# Patient Record
Sex: Male | Born: 1990 | Race: White | Hispanic: No | Marital: Single | Smoking: Current every day smoker
Health system: Southern US, Community
[De-identification: ages and names within clinical notes are randomized; demographics above are authoritative.]

## PROBLEM LIST (undated history)

## (undated) DIAGNOSIS — F909 Attention-deficit hyperactivity disorder, unspecified type: Secondary | ICD-10-CM

## (undated) DIAGNOSIS — N200 Calculus of kidney: Secondary | ICD-10-CM

## (undated) HISTORY — PX: TENDON REPAIR: SHX5111

---

## 2020-06-27 ENCOUNTER — Other Ambulatory Visit: Payer: Self-pay

## 2020-06-27 ENCOUNTER — Encounter: Payer: Self-pay | Admitting: Emergency Medicine

## 2020-06-27 DIAGNOSIS — N200 Calculus of kidney: Secondary | ICD-10-CM | POA: Insufficient documentation

## 2020-06-27 DIAGNOSIS — R109 Unspecified abdominal pain: Secondary | ICD-10-CM | POA: Insufficient documentation

## 2020-06-27 DIAGNOSIS — R110 Nausea: Secondary | ICD-10-CM | POA: Insufficient documentation

## 2020-06-27 DIAGNOSIS — Z5321 Procedure and treatment not carried out due to patient leaving prior to being seen by health care provider: Secondary | ICD-10-CM | POA: Insufficient documentation

## 2020-06-27 LAB — URINALYSIS, COMPLETE (UACMP) WITH MICROSCOPIC
Bacteria, UA: NONE SEEN
Bilirubin Urine: NEGATIVE
Glucose, UA: NEGATIVE mg/dL
Ketones, ur: NEGATIVE mg/dL
Leukocytes,Ua: NEGATIVE
Nitrite: NEGATIVE
Protein, ur: NEGATIVE mg/dL
RBC / HPF: >50 RBC/hpf — ABNORMAL HIGH (ref 0–5)
Specific Gravity, Urine: 1.024 (ref 1.005–1.030)
Squamous Epithelial / HPF: NONE SEEN (ref 0–5)
pH: 5 (ref 5.0–8.0)

## 2020-06-27 LAB — BASIC METABOLIC PANEL
Anion gap: 8 (ref 5–15)
BUN: 18 mg/dL (ref 6–20)
CO2: 30 mmol/L (ref 22–32)
Calcium: 9.6 mg/dL (ref 8.9–10.3)
Chloride: 102 mmol/L (ref 98–111)
Creatinine, Ser: 0.8 mg/dL (ref 0.61–1.24)
GFR, Estimated: >60 mL/min (ref >60)
Glucose, Bld: 99 mg/dL (ref 70–99)
Potassium: 4 mmol/L (ref 3.5–5.1)
Sodium: 140 mmol/L (ref 135–145)

## 2020-06-27 LAB — CBC
HCT: 41.3 % (ref 39.0–52.0)
Hemoglobin: 13.7 g/dL (ref 13.0–17.0)
MCH: 28.9 pg (ref 26.0–34.0)
MCHC: 33.2 g/dL (ref 30.0–36.0)
MCV: 87.1 fL (ref 80.0–100.0)
Platelets: 297 10*3/uL (ref 150–400)
RBC: 4.74 MIL/uL (ref 4.22–5.81)
RDW: 13 % (ref 11.5–15.5)
WBC: 6.5 10*3/uL (ref 4.0–10.5)
nRBC: 0 % (ref 0.0–0.2)

## 2020-06-27 NOTE — ED Triage Notes (Signed)
Pt arrived via POV with reports of L flank pain and nausea. Pt states he has hx of kidney stones, sxs started 2 weeks ago.  Pt reports he had multiple stones in L kidney last time.

## 2020-06-28 ENCOUNTER — Emergency Department
Admission: EM | Admit: 2020-06-28 | Discharge: 2020-06-28 | Disposition: A | Discharge status: Home / Self Care | Payer: Self-pay | Attending: Emergency Medicine | Admitting: Emergency Medicine

## 2020-06-28 HISTORY — DX: Calculus of kidney: N20.0

## 2021-04-01 ENCOUNTER — Emergency Department
Admission: EM | Admit: 2021-04-01 | Discharge: 2021-04-01 | Disposition: A | Discharge status: Home / Self Care | Payer: Self-pay | Attending: Student in an Organized Health Care Education/Training Program | Admitting: Student in an Organized Health Care Education/Training Program

## 2021-04-01 ENCOUNTER — Encounter: Payer: Self-pay | Admitting: Radiology

## 2021-04-01 ENCOUNTER — Other Ambulatory Visit: Payer: Self-pay

## 2021-04-01 ENCOUNTER — Emergency Department: Payer: Self-pay

## 2021-04-01 DIAGNOSIS — F17210 Nicotine dependence, cigarettes, uncomplicated: Secondary | ICD-10-CM | POA: Insufficient documentation

## 2021-04-01 DIAGNOSIS — Z20822 Contact with and (suspected) exposure to covid-19: Secondary | ICD-10-CM | POA: Insufficient documentation

## 2021-04-01 DIAGNOSIS — J069 Acute upper respiratory infection, unspecified: Secondary | ICD-10-CM | POA: Insufficient documentation

## 2021-04-01 LAB — RESP PANEL BY RT-PCR (FLU A&B, COVID) ARPGX2
Influenza A by PCR: NEGATIVE
Influenza B by PCR: NEGATIVE
SARS Coronavirus 2 by RT PCR: NEGATIVE

## 2021-04-01 MED ORDER — BENZONATATE 100 MG PO CAPS: ORAL_CAPSULE | ORAL | 0 refills | Status: AC

## 2021-04-01 MED ORDER — AZITHROMYCIN 250 MG PO TABS: ORAL_TABLET | ORAL | 0 refills | Status: DC

## 2021-04-01 MED ORDER — PREDNISONE 20 MG PO TABS: 40.0000 mg | ORAL_TABLET | Freq: Every day | ORAL | 0 refills | Status: AC

## 2021-04-01 NOTE — Discharge Instructions (Addendum)
Take the prescription meds as directed. Follow-up with your provider for continued symptoms.  

## 2021-04-01 NOTE — ED Triage Notes (Signed)
Pt states cough with yellow sputum production. Pt states for a week has been having nasal drainage for a week. Pt states today had a fever. Pt appears in no acute distress.

## 2021-04-03 NOTE — ED Provider Notes (Signed)
[facility] Emergency Department Provider Note ____________________________________________  Time seen: 2255  I have reviewed the triage vital signs and the nursing notes.  HISTORY  Chief Complaint  Cough   HPI Thomas Buck is a 30 y.o. male presents himself to the ED for evaluation of productive cough as well as some nasal drainage and congestion for the last week.  Patient denies any known fevers until today noting low-grade temps.  He denies any shortness of breath, chest pain, nausea, vomiting, or diarrhea.  Past Medical History:  Diagnosis Date   Kidney stones     There are no problems to display for this patient.   History reviewed. No pertinent surgical history.  Prior to Admission medications   Medication Sig Start Date End Date Taking? Authorizing Provider  azithromycin (ZITHROMAX Z-PAK) 250 MG tablet Take 2 tablets (500 mg) on  Day 1,  followed by 1 tablet (250 mg) once daily on Days 2 through 5. 04/01/21  Yes Jadea Shiffer, Charlesetta Ivory, PA-C  benzonatate (TESSALON PERLES) 100 MG capsule Take 1-2 tabs TID prn cough 04/01/21  Yes Margaret Staggs, Charlesetta Ivory, PA-C  predniSONE (DELTASONE) 20 MG tablet Take 2 tablets (40 mg total) by mouth daily with breakfast for 5 days. 04/01/21 04/06/21 Yes Linsie Lupo, Charlesetta Ivory, PA-C    Allergies Meloxicam  History reviewed. No pertinent family history.  Social History Social History   Tobacco Use   Smoking status: Every Day    Packs/day: 0.50    Types: Cigarettes   Smokeless tobacco: Never    Review of Systems  Constitutional: Negative for fever. Eyes: Negative for visual changes. ENT: Negative for sore throat. Cardiovascular: Negative for chest pain. Respiratory: Negative for shortness of breath.  Reports productive cough as above. Gastrointestinal: Negative for abdominal pain, vomiting and diarrhea. Genitourinary: Negative for dysuria. Musculoskeletal: Negative for back pain. Skin: Negative for  rash. Neurological: Negative for headaches, focal weakness or numbness. ____________________________________________  PHYSICAL EXAM:  VITAL SIGNS: ED Triage Vitals  Enc Vitals Group     BP 04/01/21 1913 108/65     Pulse Rate 04/01/21 1913 81     Resp 04/01/21 1913 18     Temp 04/01/21 1913 99 F (37.2 C)     Temp Source 04/01/21 1913 Oral     SpO2 04/01/21 1913 100 %     Weight 04/01/21 1914 155 lb (70.3 kg)     Height 04/01/21 1914 5\' 7"  (1.702 m)     Head Circumference --      Peak Flow --      Pain Score 04/01/21 1914 8     Pain Loc --      Pain Edu? --      Excl. in GC? --     Constitutional: Alert and oriented. Well appearing and in no distress. Head: Normocephalic and atraumatic. Eyes: Conjunctivae are normal. PERRL. Normal extraocular movements Ears: Canals clear. TMs intact bilaterally. Nose: No congestion/rhinorrhea/epistaxis. Mouth/Throat: Mucous membranes are moist. Neck: Supple. No thyromegaly. Hematological/Lymphatic/Immunological: No cervical lymphadenopathy. Cardiovascular: Normal rate, regular rhythm. Normal distal pulses. Respiratory: Normal respiratory effort. No wheezes/rales/rhonchi. Gastrointestinal: Soft and nontender. No distention. Musculoskeletal: Nontender with normal range of motion in all extremities.  Neurologic:  Normal gait without ataxia. Normal speech and language. No gross focal neurologic deficits are appreciated. Skin:  Skin is warm, dry and intact. No rash noted. Psychiatric: Mood and affect are normal. Patient exhibits appropriate insight and judgment. ____________________________________________    {LABS (pertinent positives/negatives) Labs Reviewed  RESP PANEL BY RT-PCR (FLU A&B, COVID) ARPGX2  ____________________________________________  {EKG  ____________________________________________   RADIOLOGY  CXR  IMPRESSION: No active cardiopulmonary  disease. ____________________________________________  PROCEDURES    Procedures ____________________________________________   INITIAL IMPRESSION / ASSESSMENT AND PLAN / ED COURSE  As part of my medical decision making, I reviewed the following data within the electronic MEDICAL RECORD NUMBER Labs reviewed WNL, Radiograph reviewed NAD, and Notes from prior ED visits   DDX: CAP, covid, influenza  Patient ED evaluation of productive cough and nasal drainage for the last week.  He is evaluated in the ED for his complaints, found to have a negative viral panel screen and a reassuring chest x-ray.  He will be treated empirically with antibiotics, steroids, and cough medicines.  He is encouraged to follow with primary provider or return to the ED for worsening symptoms.  Thomas Buck was evaluated in Emergency Department on 04/03/2021 for the symptoms described in the history of present illness. He was evaluated in the context of the global COVID-19 pandemic, which necessitated consideration that the patient might be at risk for infection with the SARS-CoV-2 virus that causes COVID-19. Institutional protocols and algorithms that pertain to the evaluation of patients at risk for COVID-19 are in a state of rapid change based on information released by regulatory bodies including the CDC and federal and state organizations. These policies and algorithms were followed during the patient's care in the ED. ____________________________________________  FINAL CLINICAL IMPRESSION(S) / ED DIAGNOSES  Final diagnoses:  Viral URI with cough      Gerasimos Plotts, Charlesetta Ivory, PA-C 04/03/21 2032    Willy Eddy, MD 04/04/21 [id number]

## 2021-08-03 ENCOUNTER — Other Ambulatory Visit: Payer: Self-pay

## 2021-08-03 ENCOUNTER — Emergency Department: Payer: Self-pay

## 2021-08-03 DIAGNOSIS — Z5321 Procedure and treatment not carried out due to patient leaving prior to being seen by health care provider: Secondary | ICD-10-CM | POA: Insufficient documentation

## 2021-08-03 LAB — COMPREHENSIVE METABOLIC PANEL
ALT: 28 U/L (ref 0–44)
AST: 28 U/L (ref 15–41)
Albumin: 4.2 g/dL (ref 3.5–5.0)
Alkaline Phosphatase: 48 U/L (ref 38–126)
Anion gap: 6 (ref 5–15)
BUN: 13 mg/dL (ref 6–20)
CO2: 29 mmol/L (ref 22–32)
Calcium: 9.3 mg/dL (ref 8.9–10.3)
Chloride: 104 mmol/L (ref 98–111)
Creatinine, Ser: 0.84 mg/dL (ref 0.61–1.24)
GFR, Estimated: >60 mL/min (ref >60)
Glucose, Bld: 84 mg/dL (ref 70–99)
Potassium: 4 mmol/L (ref 3.5–5.1)
Sodium: 139 mmol/L (ref 135–145)
Total Bilirubin: 0.4 mg/dL (ref 0.3–1.2)
Total Protein: 6.8 g/dL (ref 6.5–8.1)

## 2021-08-03 LAB — CBC
HCT: 40.9 % (ref 39.0–52.0)
Hemoglobin: 13.2 g/dL (ref 13.0–17.0)
MCH: 28.7 pg (ref 26.0–34.0)
MCHC: 32.3 g/dL (ref 30.0–36.0)
MCV: 88.9 fL (ref 80.0–100.0)
Platelets: 267 10*3/uL (ref 150–400)
RBC: 4.6 MIL/uL (ref 4.22–5.81)
RDW: 13.2 % (ref 11.5–15.5)
WBC: 7.9 10*3/uL (ref 4.0–10.5)
nRBC: 0 % (ref 0.0–0.2)

## 2021-08-03 LAB — TROPONIN I (HIGH SENSITIVITY): Troponin I (High Sensitivity): 2 ng/L (ref <18)

## 2021-08-03 NOTE — ED Provider Triage Note (Signed)
Emergency Medicine Provider Triage Evaluation Note  Thomas Buck, a 31 y.o. male  was evaluated in triage.  Pt complains of chest pain, with onset yesterday. He describes central chest pain that feels like pressure. He denies nausea, SOB, or diaphoresis.   Review of Systems  Positive: CP Negative: FCS  Physical Exam  There were no vitals taken for this visit. Gen:   Awake, no distress   Resp:  Normal effort CTA MSK:   Moves extremities without difficulty  Other:  CVS: RRR  Medical Decision Making  Medically screening exam initiated at 7:45 PM.  Appropriate orders placed.  [name] was informed that the remainder of the evaluation will be completed by another provider, this initial triage assessment does not replace that evaluation, and the importance of remaining in the ED until their evaluation is complete.  Patient with ED evaluation of chest pain with onset yesterday.    [name], PA-C 08/03/21 1950

## 2021-08-03 NOTE — ED Triage Notes (Signed)
Pt with central chest pain for several days. Pt states is also having pain in his back. MSE performed in triage.

## 2021-08-04 ENCOUNTER — Emergency Department
Admission: EM | Admit: 2021-08-04 | Discharge: 2021-08-04 | Discharge status: Against Medical Advice | Payer: Self-pay | Attending: Emergency Medicine | Admitting: Emergency Medicine

## 2021-08-04 NOTE — ED Notes (Signed)
Called pt several times no answer  

## 2022-04-07 IMAGING — CR DG CHEST 2V
1 series · 2 of 2 positions shown · non-contrast
Comparison: 04/01/2021

CLINICAL DATA: Chest pain

EXAM:
CHEST - 2 VIEW

[Series 1: dg chest 2 view · 0.14mm/px · 2 of 2 slices shown]
[im 1/2]
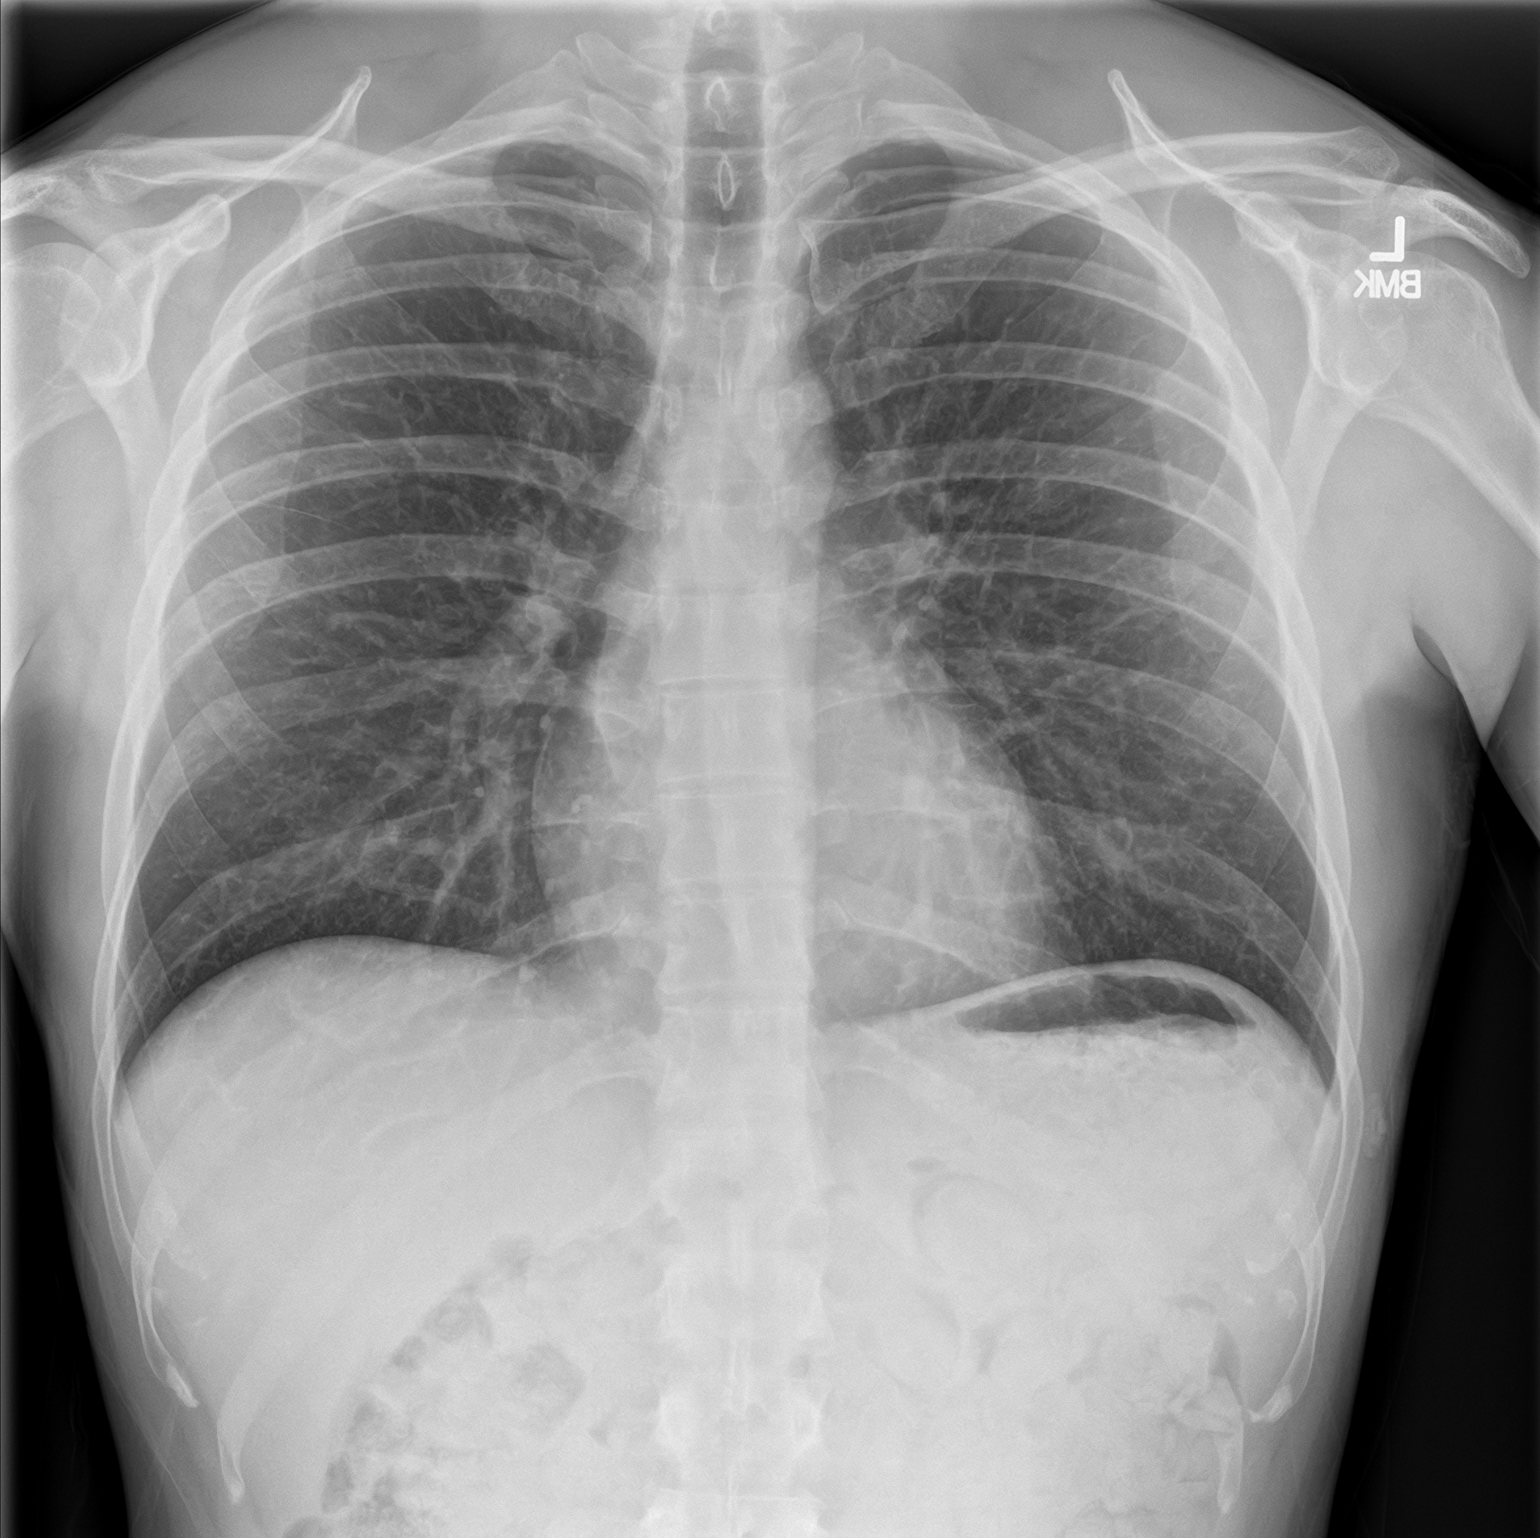
[im 2/2]
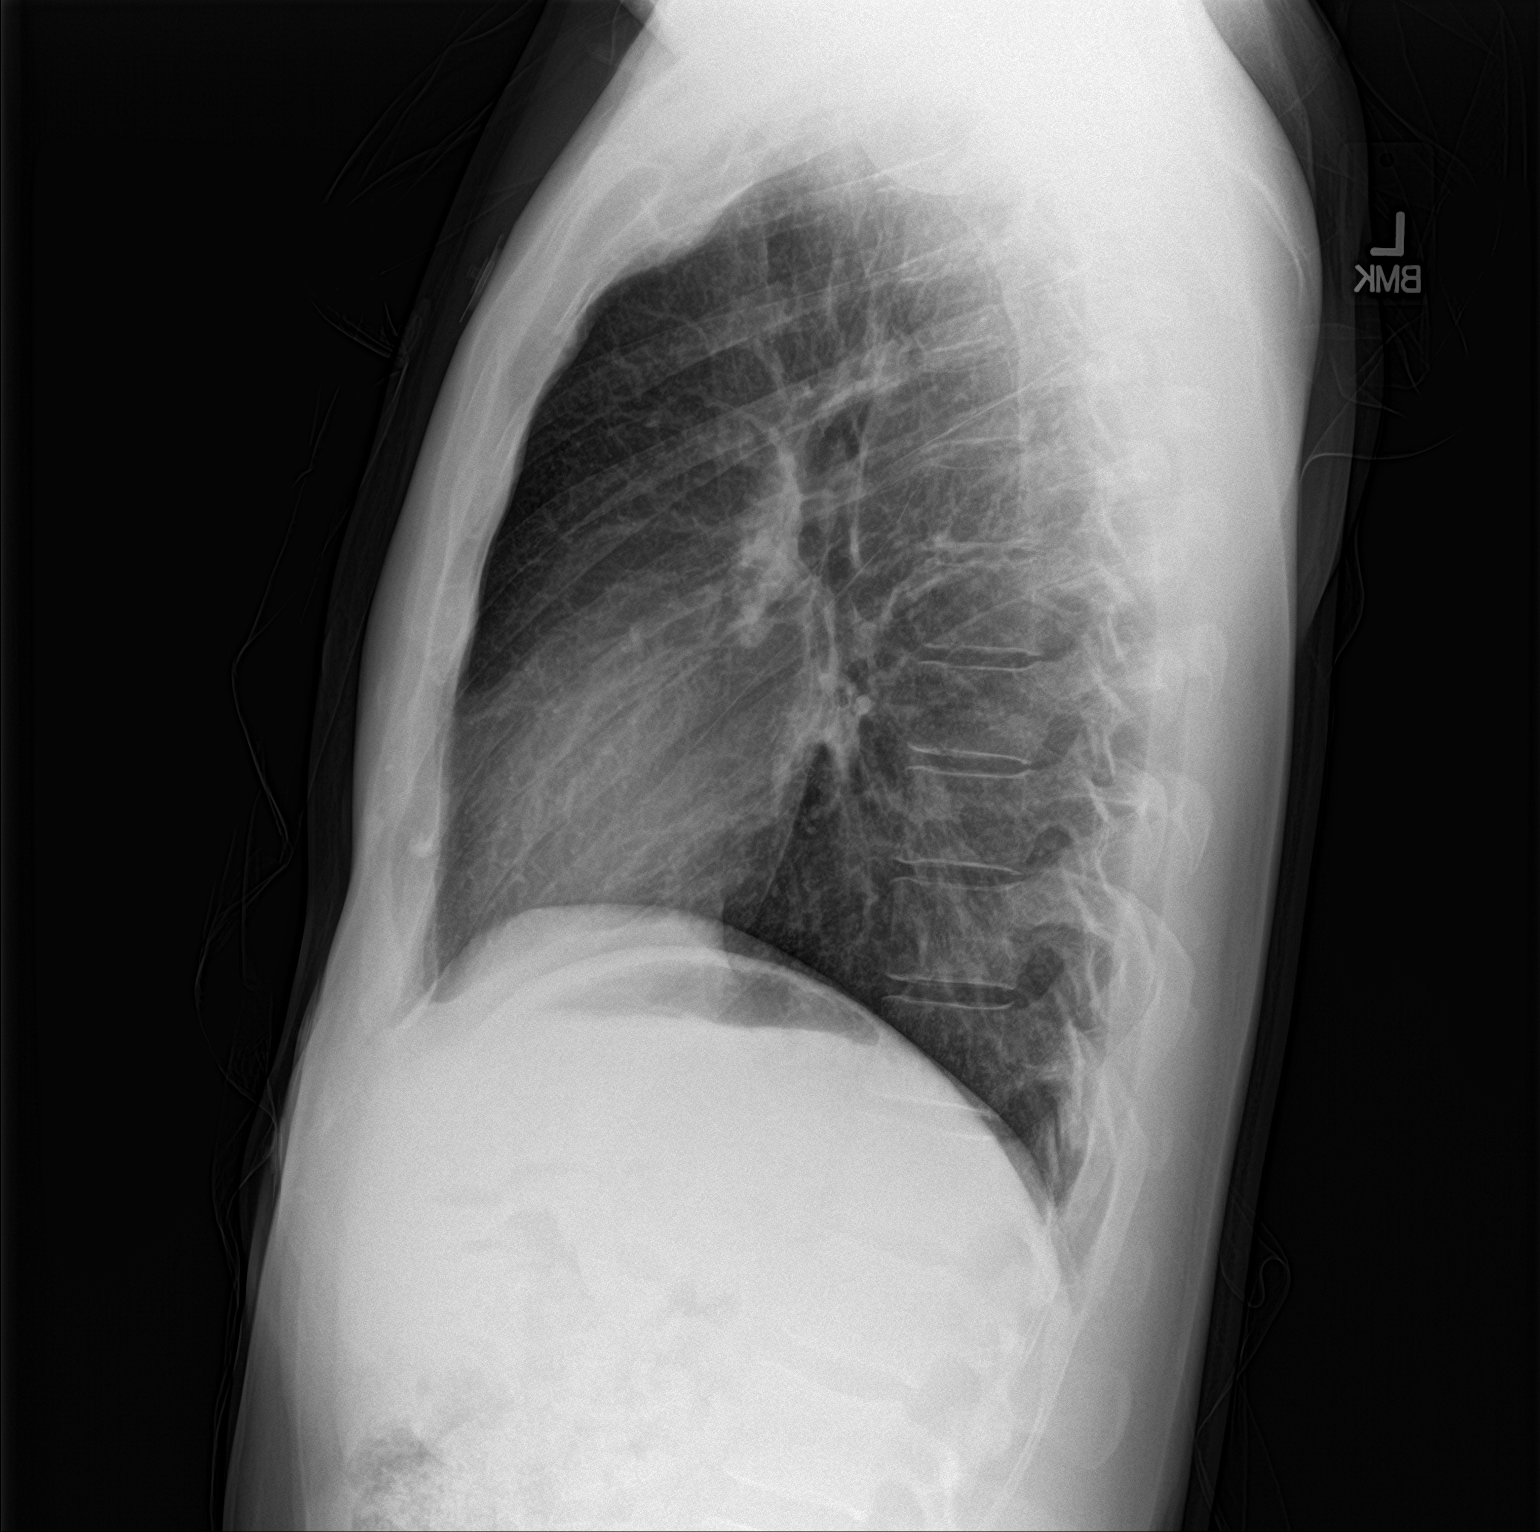

[2 of 2 positions shown; findings below may reference images not displayed]

FINDINGS: The heart size and mediastinal contours are within normal limits.
Both lungs are clear. The visualized skeletal structures are
unremarkable.
IMPRESSION: Negative.

## 2023-04-03 ENCOUNTER — Other Ambulatory Visit: Payer: Self-pay

## 2023-04-03 ENCOUNTER — Encounter: Payer: Self-pay | Admitting: Emergency Medicine

## 2023-04-03 DIAGNOSIS — R911 Solitary pulmonary nodule: Secondary | ICD-10-CM | POA: Insufficient documentation

## 2023-04-03 DIAGNOSIS — R1033 Periumbilical pain: Secondary | ICD-10-CM | POA: Insufficient documentation

## 2023-04-03 NOTE — ED Triage Notes (Signed)
Pt presents ambulatory to triage via POV with complaints of lower abdominal pain x 2 weeks. Pt endorses a "buldge" resembling a hernia but it hasn't been confirmed. Endorses pain 2/10 without radiation. He notes that the pain becomes so intense it makes him nauseated. A&Ox4 at this time. Denies CP or SOB.

## 2023-04-04 ENCOUNTER — Emergency Department
Admission: EM | Admit: 2023-04-04 | Discharge: 2023-04-04 | Disposition: A | Discharge status: Home / Self Care | Payer: BC Managed Care – PPO | Attending: Emergency Medicine | Admitting: Emergency Medicine

## 2023-04-04 ENCOUNTER — Emergency Department: Payer: BC Managed Care – PPO

## 2023-04-04 DIAGNOSIS — R911 Solitary pulmonary nodule: Secondary | ICD-10-CM

## 2023-04-04 DIAGNOSIS — R1033 Periumbilical pain: Secondary | ICD-10-CM

## 2023-04-04 LAB — CBC
HCT: 39 % (ref 39.0–52.0)
Hemoglobin: 12.9 g/dL — ABNORMAL LOW (ref 13.0–17.0)
MCH: 28.8 pg (ref 26.0–34.0)
MCHC: 33.1 g/dL (ref 30.0–36.0)
MCV: 87.1 fL (ref 80.0–100.0)
Platelets: 318 10*3/uL (ref 150–400)
RBC: 4.48 MIL/uL (ref 4.22–5.81)
RDW: 13.5 % (ref 11.5–15.5)
WBC: 5.3 10*3/uL (ref 4.0–10.5)
nRBC: 0 % (ref 0.0–0.2)

## 2023-04-04 LAB — COMPREHENSIVE METABOLIC PANEL
ALT: 21 U/L (ref 0–44)
AST: 20 U/L (ref 15–41)
Albumin: 4.2 g/dL (ref 3.5–5.0)
Alkaline Phosphatase: 54 U/L (ref 38–126)
Anion gap: 8 (ref 5–15)
BUN: 19 mg/dL (ref 6–20)
CO2: 29 mmol/L (ref 22–32)
Calcium: 9.5 mg/dL (ref 8.9–10.3)
Chloride: 101 mmol/L (ref 98–111)
Creatinine, Ser: 0.82 mg/dL (ref 0.61–1.24)
GFR, Estimated: >60 mL/min (ref >60)
Glucose, Bld: 92 mg/dL (ref 70–99)
Potassium: 3.9 mmol/L (ref 3.5–5.1)
Sodium: 138 mmol/L (ref 135–145)
Total Bilirubin: 0.7 mg/dL (ref 0.3–1.2)
Total Protein: 7.3 g/dL (ref 6.5–8.1)

## 2023-04-04 LAB — URINALYSIS, ROUTINE W REFLEX MICROSCOPIC
Bacteria, UA: NONE SEEN
Bilirubin Urine: NEGATIVE
Glucose, UA: NEGATIVE mg/dL
Ketones, ur: NEGATIVE mg/dL
Leukocytes,Ua: NEGATIVE
Nitrite: NEGATIVE
Protein, ur: NEGATIVE mg/dL
Specific Gravity, Urine: 1.018 (ref 1.005–1.030)
Squamous Epithelial / HPF: 0 /HPF (ref 0–5)
pH: 5 (ref 5.0–8.0)

## 2023-04-04 LAB — LIPASE, BLOOD: Lipase: 37 U/L (ref 11–51)

## 2023-04-04 NOTE — Discharge Instructions (Addendum)
No signs of a hernia or kidney stone.   Nodule on the left side of your lung, as we talked about

## 2023-04-04 NOTE — ED Notes (Signed)
Patient discharged at this time. Ambulated to lobby with independent and steady gait. Breathing unlabored speaking in full sentences. Verbalized understanding of all discharge, follow up, and medication teaching. Discharged homed with all belongings.

## 2023-04-04 NOTE — ED Provider Notes (Signed)
[facility] Provider Note    Event Date/Time   First MD Initiated Contact with Patient 04/04/23 0110     (approximate)   History   Abdominal Pain   HPI  Thomas Buck is a 32 y.o. male who presents to the ED for evaluation of Abdominal Pain   Review of PCP visit from 2 months ago.  Chronic pain, ADHD and opiate addiction in the past who is currently on Suboxone.  Patient presents for evaluation of intermittent periumbilical abdominal pain over the past few weeks and seem to be worsening throughout the day today.  Does report a history of kidney stones but uncertain if that is what he is feeling.  Reports concern for hernia because he thought he felt something on his anterior abdomen just superior to his bellybutton.  Reports compliance to his Suboxone.  No other medications.  No emesis or stool changes, chest pain.  Sometimes nausea.  No history of abdominal surgeries   Physical Exam   Triage Vital Signs: ED Triage Vitals  Encounter Vitals Group     BP 04/03/23 2352 126/77     Systolic BP Percentile --      Diastolic BP Percentile --      Pulse Rate 04/03/23 2352 (!) 58     Resp 04/03/23 2352 18     Temp 04/03/23 2352 98 F (36.7 C)     Temp Source 04/03/23 2352 Oral     SpO2 04/03/23 2352 100 %     Weight 04/03/23 2351 150 lb (68 kg)     Height 04/03/23 2351 5\' 7"  (1.702 m)     Head Circumference --      Peak Flow --      Pain Score 04/03/23 2351 2     Pain Loc --      Pain Education --      Exclude from Growth Chart --     Most recent vital signs: Vitals:   04/03/23 2352  BP: 126/77  Pulse: (!) 58  Resp: 18  Temp: 98 F (36.7 C)  SpO2: 100%    General: Awake, no distress.  CV:  Good peripheral perfusion.  Resp:  Normal effort.  Abd:  No distention.  No appreciable hernia to the anterior abdomen around the site of concern.  No reducible soft masses throughout the abdomen.  Soft and nontender throughout. MSK:  No deformity  noted.  Neuro:  No focal deficits appreciated. Other:     ED Results / Procedures / Treatments   Labs (all labs ordered are listed, but only abnormal results are displayed) Labs Reviewed  CBC - Abnormal; Notable for the following components:      Result Value   Hemoglobin 12.9 (*)    All other components within normal limits  URINALYSIS, ROUTINE W REFLEX MICROSCOPIC - Abnormal; Notable for the following components:   Color, Urine YELLOW (*)    APPearance HAZY (*)    Hgb urine dipstick LARGE (*)    All other components within normal limits  LIPASE, BLOOD  COMPREHENSIVE METABOLIC PANEL    EKG   RADIOLOGY CT renal study interpreted by me without evidence of ureteral obstruction  Official radiology report(s): CT Renal Stone Study  Result Date: 04/04/2023 CLINICAL DATA:  Periumbilical pain with hematuria EXAM: CT ABDOMEN AND PELVIS WITHOUT CONTRAST TECHNIQUE: Multidetector CT imaging of the abdomen and pelvis was performed following the standard protocol without IV contrast. RADIATION DOSE REDUCTION: This exam was performed according to  the departmental dose-optimization program which includes automated exposure control, adjustment of the mA and/or kV according to patient size and/or use of iterative reconstruction technique. COMPARISON:  None Available. FINDINGS: Lower chest: Lung bases are free of acute infiltrate. 14 mm pleural base nodule is noted in the left lower lobe. Given the patient's age is likely postinflammatory in nature. Hepatobiliary: No focal liver abnormality is seen. No gallstones, gallbladder wall thickening, or biliary dilatation. Pancreas: Unremarkable. No pancreatic ductal dilatation or surrounding inflammatory changes. Spleen: Normal in size without focal abnormality. Adrenals/Urinary Tract: Adrenal glands are within normal limits. Kidneys show no renal calculi or obstructive change. The bladder is decompressed. Stomach/Bowel: Scattered fecal material is noted  throughout the colon consistent with mild constipation. No obstructive changes are seen. The appendix is within normal limits. Small bowel and stomach are unremarkable. Vascular/Lymphatic: No significant vascular findings are present. No enlarged abdominal or pelvic lymph nodes. Reproductive: Prostate is unremarkable. Other: No abdominal wall hernia or abnormality. No abdominopelvic ascites. Musculoskeletal: No acute or significant osseous findings. IMPRESSION: Mild colonic constipation. No renal calculi or obstructive changes are noted. 14 mm pleural based nodule in the left lower lobe. This likely is postinflammatory in nature given the patient's age although short-term follow-up is recommended in 3 months. Electronically Signed   By: Alcide Clever M.D.   On: 04/04/2023 02:22    PROCEDURES and INTERVENTIONS:  Procedures  Medications - No data to display   IMPRESSION / MDM / ASSESSMENT AND PLAN / ED COURSE  I reviewed the triage vital signs and the nursing notes.  Differential diagnosis includes, but is not limited to, ureteral stone, SBO, constipation, opiate withdrawals, pancreatitis  {Patient presents with symptoms of an acute illness or injury that is potentially life-threatening.  Patient presents with subacute abdominal discomfort without evidence of acute pathology and suitable for outpatient management.  Reassuring vitals and clinical picture.  Blood work with normal CBC, lipase and metabolic panel.  Hemoglobin in the urine is noted and so CT renal studies obtained without evidence of ureteral stone or any hernias.  Pain control, minimal symptoms here and suitable for outpatient management.  Informed patient of pulmonary nodules and referred to clinic  Clinical Course as of 04/04/23 0500  Fri Apr 04, 2023  0247 Reassessed.  Reports feeling fine.  We discussed CT results, pulmonary nodule, no kidney stones or hernias. [DS]    Clinical Course User Index [DS] Delton Prairie, MD      FINAL CLINICAL IMPRESSION(S) / ED DIAGNOSES   Final diagnoses:  Lung nodule  Periumbilical abdominal pain     Rx / DC Orders   ED Discharge Orders          Ordered    AMB  Referral to Pulmonary Nodule Clinic        04/04/23 0242             Note:  This document was prepared using Dragon voice recognition software and may include unintentional dictation errors.   Delton Prairie, MD 04/04/23 [id number]

## 2023-04-04 NOTE — ED Notes (Signed)
ED Provider at bedside. 

## 2023-08-16 DEATH — deceased

## 2024-01-26 ENCOUNTER — Emergency Department
Admission: EM | Admit: 2024-01-26 | Discharge: 2024-01-26 | Disposition: A | Discharge status: Home / Self Care | Attending: Emergency Medicine | Admitting: Emergency Medicine

## 2024-01-26 ENCOUNTER — Other Ambulatory Visit: Payer: Self-pay

## 2024-01-26 DIAGNOSIS — M25511 Pain in right shoulder: Secondary | ICD-10-CM

## 2024-01-26 DIAGNOSIS — S46011A Strain of muscle(s) and tendon(s) of the rotator cuff of right shoulder, initial encounter: Secondary | ICD-10-CM | POA: Diagnosis not present

## 2024-01-26 DIAGNOSIS — X500XXA Overexertion from strenuous movement or load, initial encounter: Secondary | ICD-10-CM | POA: Insufficient documentation

## 2024-01-26 MED ORDER — PREDNISONE 10 MG (21) PO TBPK: ORAL_TABLET | ORAL | 0 refills | Status: AC

## 2024-01-26 MED ORDER — BACLOFEN 10 MG PO TABS: 10.0000 mg | ORAL_TABLET | Freq: Three times a day (TID) | ORAL | 0 refills | Status: AC

## 2024-01-26 NOTE — ED Provider Notes (Signed)
 [facility] Provider Note    Event Date/Time   First MD Initiated Contact with Patient 01/26/24 1746     (approximate)   History   Shoulder Pain   HPI  Thomas Buck is a 33 y.o. male history of kidney stones, is currently on Suboxone and ADHD medication presents emergency department with complaints of right shoulder pain.  Patient woke up with severe right shoulder pain.  States it affected his whole right side even to the testicle but then went back to bed and the area returned to normal.  Since then he feels a lot of pain when he tries to lift the right arm.  No numbness or tingling.  Pain is right around the clavicle and into the back of the shoulder.  No known injury.  Does do repetitive motions at work.  He is right-handed.      Physical Exam   Triage Vital Signs: ED Triage Vitals [01/26/24 1557]  Encounter Vitals Group     BP 114/74     Girls Systolic BP Percentile      Girls Diastolic BP Percentile      Boys Systolic BP Percentile      Boys Diastolic BP Percentile      Pulse Rate 74     Resp 20     Temp 98.1 F (36.7 C)     Temp Source Oral     SpO2 100 %     Weight 126 lb (57.2 kg)     Height 5' 7 (1.702 m)     Head Circumference      Peak Flow      Pain Score 7     Pain Loc      Pain Education      Exclude from Growth Chart     Most recent vital signs: Vitals:   01/26/24 1557  BP: 114/74  Pulse: 74  Resp: 20  Temp: 98.1 F (36.7 C)  SpO2: 100%     General: Awake, no distress.   CV:  Good peripheral perfusion.  Resp:  Normal effort.  Abd:  No distention.   Other:  Right shoulder tender along the musculature and at the joint capsule, decreased range of motion with overhead reach, internal rotation, neurovascular intact   ED Results / Procedures / Treatments   Labs (all labs ordered are listed, but only abnormal results are displayed) Labs Reviewed - No data to  display   EKG     RADIOLOGY     PROCEDURES:   Procedures  Critical Care:  no Chief Complaint  Patient presents with   Shoulder Pain      MEDICATIONS ORDERED IN ED: Medications - No data to display   IMPRESSION / MDM / ASSESSMENT AND PLAN / ED COURSE  I reviewed the triage vital signs and the nursing notes.                              Differential diagnosis includes, but is not limited to, capsulitis, tendinitis, rotator cuff strain, tear, dislocation  Patient's presentation is most consistent with acute illness / injury with system symptoms.   Explained to the patient this does not appear to be a bone problem, did offer do a x-ray but he states if he is just having muscle pain he would rather not get a x-ray at this time.  I agree with this as it may not give us  any  further information.  I did place him in a sling.  He is to apply ice to the right shoulder.  Started him on a Sterapred Dosepak and muscle relaxer.  Did not want to give him any kind of pain medication secondary to his being on Suboxone.  He is in agreement with his treatment plan.  He is to follow-up with orthopedics.  Discharged stable condition.  Work note provided with no use of the right arm for 1 week      FINAL CLINICAL IMPRESSION(S) / ED DIAGNOSES   Final diagnoses:  Acute pain of right shoulder  Strain of right rotator cuff capsule, initial encounter     Rx / DC Orders   ED Discharge Orders          Ordered    predniSONE  (STERAPRED UNI-PAK 21 TAB) 10 MG (21) TBPK tablet        01/26/24 1804    baclofen  (LIORESAL ) 10 MG tablet  3 times daily        01/26/24 1804             Note:  This document was prepared using Dragon voice recognition software and may include unintentional dictation errors.    Gasper Devere ORN, PA-C 01/26/24 [id number]    Malvina Alm DASEN, MD 01/26/24 2209

## 2024-01-26 NOTE — Discharge Instructions (Signed)
 Apply ice to the right shoulder Take the medication as prescribed Follow up with orthopedics if not improving in 3 days

## 2024-01-26 NOTE — ED Triage Notes (Signed)
 Pt to ED for R shoulder pain. Woke up to severe R testicular pain and R shoulder pain. Then fell asleep and woke up again around 6am and still had shoulder pain but no testicle pain. Since then, cannot lift R arm without pain. Pt appears to be holding R arm in position of comfort. No known injury. Pt also had redness and swelling yesterday to R forearm but this resolved.  Currently having R shoulder and back pain around clavicle) and R neck. Cannot turn head without pain. Holding head and R arm still. No obvious deformity.

## 2024-03-24 NOTE — Progress Notes (Signed)
 Primary Care Provider: Traci Winn Hun, MD   SUBJECTIVE  HPI: (portions may be brought forward from prior visit) Drug of choice: Oxycodone   route: oral/snort Last use of 2017 Stated goals of treatment: abstinence, interested in tapering.  Craving-no Withdrawal-no Last overdose - no Other substances - THC remote also (quit around 2017 - caused some paranoia) Support (family/spiritual/other): fiance, children, some therapy.   Additional reports since last visit: interested in tapering.  Oxy was started at [facility] for neck/back with some numbness and tingling. Additional reports since last visit: Been dealing with pain due to neck injury since our last visit. Had injury  History of Present Illness Thomas Buck is a 33 year old male who presents for FMLA paperwork and medication management related to ADHD and chronic pain.  Attention-deficit/hyperactivity disorder symptoms and management - Experiences difficulties with social interactions, becoming overwhelmed and sometimes enraged, affecting workplace interactions - Anxiety related to work and time management issues leads to frequent absences, approximately four to five times per month - Currently takes Adderall 10 mg twice daily, which provides a calming effect and slows thought process, aiding in anxiety and decision-making - No side effects from Adderall, including no palpitations or sleep disturbances - Evaluating whether current Adderall dosage is sufficient for emotional regulation and executive function issues - while it is helping a lot, he wonders if a different med or dose might help more with the executive function and emotional regulation - Requires treatment visits at least twice a year, with monthly appointments lasting two to three hours, necessitating FMLA paperwork - he is interested in therapy but feels he cannot miss more work  Chronic pain and musculoskeletal symptoms - Neck and shoulder pain  following an injury in July; x-rays performed - had neck and shoulder pain  Shoulder pain characterized by soreness, catching, clicking, and popping, especially with use and handling heavy materials at work - Shoulder pain described as an overuse injury, exacerbated by occupational activities - Maintaining good posture and occasional icing provide some relief - No other treatments attempted for pain management - Believes a prior leg injury may have contributed to the shoulder injury  Opioid use disorder management - Takes Suboxone: half a tablet in the morning, half at lunch, and one in the evening - No cravings or withdrawal symptoms while on Suboxone   Adverse Effects The patient is not experiencing significant side effects of therapy. These include:none    OBJECTIVE  Vitals:   03/24/24 1350  BP: 110/70  Pulse: 87  Temp: 36.7 C (98.1 F)  TempSrc: Oral  SpO2: 100%  Weight: 62.1 kg (137 lb)  PainSc:   4  PainLoc: Neck   Physical Exam MUSCULOSKELETAL: Slightly decreased range of motion with internal rotation of the shoulder. Slight tenderness over the humeral head. Decreased range of motion in the affected arm compared to the other. Slight pain with internal rotation against resistance. No pain with external rotation. No pain with abduction or adduction with resistance. Physical Exam Vitals reviewed.  Constitutional:      Appearance: Normal appearance.  HENT:     Head: Normocephalic and atraumatic.     Nose: Nose normal.  Pulmonary:     Effort: Pulmonary effort is normal.  Skin:    General: Skin is warm and dry.  Neurological:     General: No focal deficit present.     Mental Status: He is alert.  Psychiatric:        Mood and Affect: Mood normal.  Behavior: Behavior normal.        Thought Content: Thought content normal.        Judgment: Judgment normal.     Signs of withdrawal on exam: none agitation, rhinorrhea, sweating, yawning, pupillary  dilatation. Signs of intoxication on exam: none  Labs: Prior UDS: appropriate for amphetamines as rx'ed and norbup/bup ratio  I did not order a Urine Drug Screen today.  PDMP check: appropriate  Relevant Labs: CBC:  Lab Results  Component Value Date   WBC 4.8 10/25/2020   HGB 13.2 (L) 10/25/2020   HCT 39.5 10/25/2020   MCV 88 10/25/2020   PLT 263 10/25/2020    BMP:    Chemistry      Component Value Date/Time   NA 140 10/25/2020 0914   K 3.7 10/25/2020 0914   CL 105 10/25/2020 0914   CO2 28 10/25/2020 0914   BUN 15 10/25/2020 0914   CREATININE 149 01/23/2024 0953   CREATININE 1.0 10/25/2020 0914   GLUCOSE 91 10/25/2020 0914      Component Value Date/Time   CALCIUM 9.2 10/25/2020 0914   ALKPHOS 50 10/17/2020 1447   AST 27 10/17/2020 1447   ALT 24 10/17/2020 1447   TBILI 0.6 10/17/2020 1447        LFTs:  Lab Results  Component Value Date   ALT 24 10/17/2020   AST 27 10/17/2020   ALKPHOS 50 10/17/2020     A1c: No results found for: HGBA1C, HBA1C  Lipids:  Lab Results  Component Value Date   CHOLTOTAL 162 09/04/2022   LDLCALC 94 09/04/2022   HDL 56 09/04/2022   TRIG 60 09/04/2022    HIV: No components found for: HIV1X2 No results found for: HIV1RNAQNT  HCV: No results found for: HEPCAB  Assessment and Plan Assessment & Plan Attention-deficit hyperactivity disorder, 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 Improved calmness and cognitive processing, persistent issues with emotional regulation and executive function. No side effects. Potential underdosing considered. - Increase Adderall to 15 mg twice daily. - Check in after 1-2 weeks to assess response. - Schedule follow-up video visit in one month.  Opioid dependence on medication-assisted therapy Opioid dependence managed with Suboxone. No cravings or withdrawal symptoms. Pain management needed for neck and shoulder issues. - Continue current  Suboxone regimen. - Address pain management through physical therapy for shoulder and neck issues.  Right shoulder pain due to overuse rotator cuff injury Right shoulder pain from overuse rotator cuff injury. Pain worsened by repetitive motion and heavy lifting. Discussed physical therapy and steroid injection benefits. - Provide referral for physical therapy. - Consider steroid injection if physical therapy is ineffective.  Chronic neck pain Chronic neck pain possibly related to work activities and posture. Pain management focuses on symptom relief. - Encourage posture correction and ergonomic adjustments. - Consider referral to pain psychology if symptoms persist.  Generalized anxiety symptoms Generalized anxiety impacting social interactions and work. Discussed therapy for anxiety and emotional regulation. Explored telehealth options. May be due to undertreated ADHD - will adjust adhd meds and reassess - adjust ADHD treatment and reassess - Explore telehealth options for therapy.     1. Flu vaccine need -     Flu Vaccine IIV3, IM PF (22MO+)(Fluarix, Flulaval, Fluzone)  2. Opioid dependence on agonist therapy (CMS/HHS-HCC) -     buprenorphine-naloxone (SUBOXONE) 8-2 mg SL film; Take 1/2 tablet in the morning, 1/2 tablet at lunch time and 1 tablet in the evening for better  pain effect.  Dispense: 60 Film; Refill: 1  3. Medication monitoring encounter -     buprenorphine-naloxone (SUBOXONE) 8-2 mg SL film; Take 1/2 tablet in the morning, 1/2 tablet at lunch time and 1 tablet in the evening for better pain effect.  Dispense: 60 Film; Refill: 1  4. Rotator cuff arthropathy, right -     Ambulatory Referral to Physical Therapy  5. Attention deficit hyperactivity disorder (ADHD), predominantly inattentive type -     dextroamphetamine-amphetamine (ADDERALL XR) 15 MG XR capsule; Take 1 capsule (15 mg total) by mouth every morning for 30 days .  Dispense: 30 capsule; Refill: 0  6. Chronic  neck pain         RTC in 4 weeks.     This visit was coded based on medical decision making (MDM).     Return in about 4 weeks (around 04/21/2024) for video visit f/u Niess SUD, .     Future Appointments     Date/Time Provider Department Center Visit Type   04/23/2024 11:30 AM (Arrive by 11:15 AM) Neville Hoy Dragon, MD [facility] VID RET ADDICTION MED       Patient Instructions  Use your PT referral to find a physicial therapy in [place].  Let me know how the 15mg  adderall twice a day goes via mychart.  We'll have a video visit in a month for adjustments.     An after visit summary was provided for the patient either in written format (printed) or through MyChart.

## 2024-05-04 NOTE — Progress Notes (Signed)
 Elected to stay on current dose of adderral until f/u visit.

## 2024-05-13 ENCOUNTER — Other Ambulatory Visit: Payer: Self-pay

## 2024-05-13 ENCOUNTER — Emergency Department
Admission: EM | Admit: 2024-05-13 | Discharge: 2024-05-13 | Disposition: A | Discharge status: Home / Self Care | Attending: Emergency Medicine | Admitting: Emergency Medicine

## 2024-05-13 ENCOUNTER — Encounter: Payer: Self-pay | Admitting: Emergency Medicine

## 2024-05-13 DIAGNOSIS — K0889 Other specified disorders of teeth and supporting structures: Secondary | ICD-10-CM | POA: Diagnosis present

## 2024-05-13 HISTORY — DX: Attention-deficit hyperactivity disorder, unspecified type: F90.9

## 2024-05-13 MED ORDER — KETOROLAC TROMETHAMINE 30 MG/ML IJ SOLN
30.0000 mg | Freq: Once | INTRAMUSCULAR | Status: AC
  Administered 2024-05-13: 30 mg via INTRAMUSCULAR
  Filled 2024-05-13: qty 1

## 2024-05-13 MED ORDER — AMOXICILLIN-POT CLAVULANATE 875-125 MG PO TABS: 1.0000 | ORAL_TABLET | Freq: Two times a day (BID) | ORAL | 0 refills | Status: AC

## 2024-05-13 MED ORDER — AMOXICILLIN-POT CLAVULANATE 875-125 MG PO TABS
1.0000 | ORAL_TABLET | Freq: Once | ORAL | Status: AC
Start: 2024-05-13 — End: 2024-05-13
  Administered 2024-05-13: 1 via ORAL
  Filled 2024-05-13: qty 1

## 2024-05-13 MED ORDER — BUPIVACAINE-EPINEPHRINE (PF) 0.5% -1:200000 IJ SOLN
10.0000 mL | Freq: Once | INTRAMUSCULAR | Status: AC
  Administered 2024-05-13: 10 mL
  Filled 2024-05-13: qty 10

## 2024-05-13 MED ORDER — BUPIVACAINE HCL (PF) 0.5 % IJ SOLN: 20.0000 mL | Freq: Once | INTRAMUSCULAR | Status: DC

## 2024-05-13 NOTE — ED Provider Notes (Signed)
 [facility] Provider Note    Event Date/Time   First MD Initiated Contact with Patient 05/13/24 2122     (approximate)   History   Dental Pain   HPI  Thomas Buck is a 33 y.o. male with history of former opiate abuse on Suboxone who presents with dental pain primarily to his left upper teeth since yesterday, severe intensity, persistent course, not relieved by Tylenol at home.  He denies any fever.  I reviewed the past medical records.  The patient's most recent outpatient encounter was with internal medicine on 9/10 for FMLA paperwork and medication management for chronic pain.   Physical Exam   Triage Vital Signs: ED Triage Vitals  Encounter Vitals Group     BP 05/13/24 2022 (!) 118/57     Girls Systolic BP Percentile --      Girls Diastolic BP Percentile --      Boys Systolic BP Percentile --      Boys Diastolic BP Percentile --      Pulse Rate 05/13/24 2022 (!) 53     Resp 05/13/24 2022 18     Temp 05/13/24 2022 (!) 97.2 F (36.2 C)     Temp Source 05/13/24 2022 Oral     SpO2 05/13/24 2022 100 %     Weight 05/13/24 2025 135 lb (61.2 kg)     Height 05/13/24 2025 5' 7 (1.702 m)     Head Circumference --      Peak Flow --      Pain Score 05/13/24 2134 10     Pain Loc --      Pain Education --      Exclude from Growth Chart --     Most recent vital signs: Vitals:   05/13/24 2022 05/13/24 2134  BP: (!) 118/57 127/84  Pulse: (!) 53 (!) 50  Resp: 18 16  Temp: (!) 97.2 F (36.2 C) 97.7 F (36.5 C)  SpO2: 100% 100%     General: Alert, uncomfortable appearing, no distress.  CV:  Good peripheral perfusion.  Resp:  Normal effort.  Abd:  No distention.  Other:  No palpable oropharyngeal abscess.  No facial swelling.   ED Results / Procedures / Treatments   Labs (all labs ordered are listed, but only abnormal results are displayed) Labs Reviewed - No data to display   EKG    RADIOLOGY    PROCEDURES:  Critical Care  performed: No  .Nerve Block  Date/Time: 05/13/2024 10:46 PM  Performed by: Jacolyn Pae, MD Authorized by: Jacolyn Pae, MD   Consent:    Consent obtained:  Verbal   Consent given by:  Patient   Risks discussed:  Bleeding, intravenous injection, swelling, unsuccessful block and pain   Alternatives discussed:  Alternative treatment Universal protocol:    Patient identity confirmed:  Verbally with patient Indications:    Indications:  Pain relief Location:    Body area:  Head   Head nerve blocked: Superior alveolar.   Laterality:  Left Procedure details:    Block needle gauge:  25 G   Anesthetic injected:  Bupivacaine 0.5% WITH epi   Injection procedure:  Anatomic landmarks identified and introduced needle Post-procedure details:    Dressing:  None   Outcome:  Anesthesia achieved    MEDICATIONS ORDERED IN ED: Medications  ketorolac (TORADOL) 30 MG/ML injection 30 mg (30 mg Intramuscular Given 05/13/24 2200)  bupivacaine-epinephrine (PF) (MARCAINE W/ EPI) 0.5% -1:200000 injection 10 mL (10 mLs Infiltration  Given 05/13/24 2245)  amoxicillin-clavulanate (AUGMENTIN) 875-125 MG per tablet 1 tablet (1 tablet Oral Given 05/13/24 2246)     IMPRESSION / MDM / ASSESSMENT AND PLAN / ED COURSE  I reviewed the triage vital signs and the nursing notes.  Differential diagnosis includes, but is not limited to, dental caries, pulpitis, other dental infection.  There is no evidence of abscess.  We will give Toradol and attempt a superior alveolar block for pain relief.  Patient's presentation is most consistent with acute, uncomplicated illness.  ----------------------------------------- 10:47 PM on 05/13/2024 -----------------------------------------  The patient tolerated the superior alveolar block well, and successful with very good pain relief.  He is comfortable and is stable for discharge home at this time.  Return precautions given, and he expresses understanding.   I have prescribed Augmentin and given him a list of dental clinics.   FINAL CLINICAL IMPRESSION(S) / ED DIAGNOSES   Final diagnoses:  Pain, dental     Rx / DC Orders   ED Discharge Orders          Ordered    amoxicillin-clavulanate (AUGMENTIN) 875-125 MG tablet  2 times daily        05/13/24 2245             Note:  This document was prepared using Dragon voice recognition software and may include unintentional dictation errors.    Jacolyn Pae, MD 05/13/24 2248

## 2024-05-13 NOTE — Discharge Instructions (Signed)
 Take the Augmentin as prescribed until you finish the course or follow-up with a dentist.  We have attached a resource guide for local dental clinics that you can contact.  In the meantime, return to the ER for new, worsening, or persistent severe pain, fever, inability to open your mouth, or any other new or worsening symptoms that concern you.  You can continue taking the counter Tylenol, ibuprofen, or meloxicam as needed for pain.  The maximum daily dose for Tylenol is 4000 mg.

## 2024-05-13 NOTE — ED Notes (Signed)
 Pt discharged to home, instructions and medications reviewed.  Pt verbalized understanding, no questions at this time.

## 2024-05-13 NOTE — ED Triage Notes (Signed)
 Pt arrives POV ambulatory to triage, gait steady, no acute distress noted c/o left upper dental pain that started yesterday; pt unable to rest due to pain, radiating into left ear, face, and head. Pt reports taking tylenol 2 hours pta.
